# Patient Record
Sex: Male | Born: 1985 | Race: Black or African American | Hispanic: No | Marital: Single | Smoking: Current every day smoker
Health system: Southern US, Community
[De-identification: ages and names within clinical notes are randomized; demographics above are authoritative.]

## PROBLEM LIST (undated history)

## (undated) DIAGNOSIS — F319 Bipolar disorder, unspecified: Secondary | ICD-10-CM

## (undated) DIAGNOSIS — F209 Schizophrenia, unspecified: Secondary | ICD-10-CM

---

## 2009-03-25 ENCOUNTER — Emergency Department (HOSPITAL_BASED_OUTPATIENT_CLINIC_OR_DEPARTMENT_OTHER): Admission: EM | Admit: 2009-03-25 | Discharge: 2009-03-25 | Payer: Self-pay | Admitting: Emergency Medicine

## 2009-03-25 ENCOUNTER — Ambulatory Visit: Payer: Self-pay | Admitting: Diagnostic Radiology

## 2010-01-01 IMAGING — CR DG CHEST 2V
2 series · 2 of 2 positions shown · non-contrast
Comparison: None

CLINICAL DATA: Lightheaded times 2 weeks

CHEST - 2 VIEW

[w chest pa]
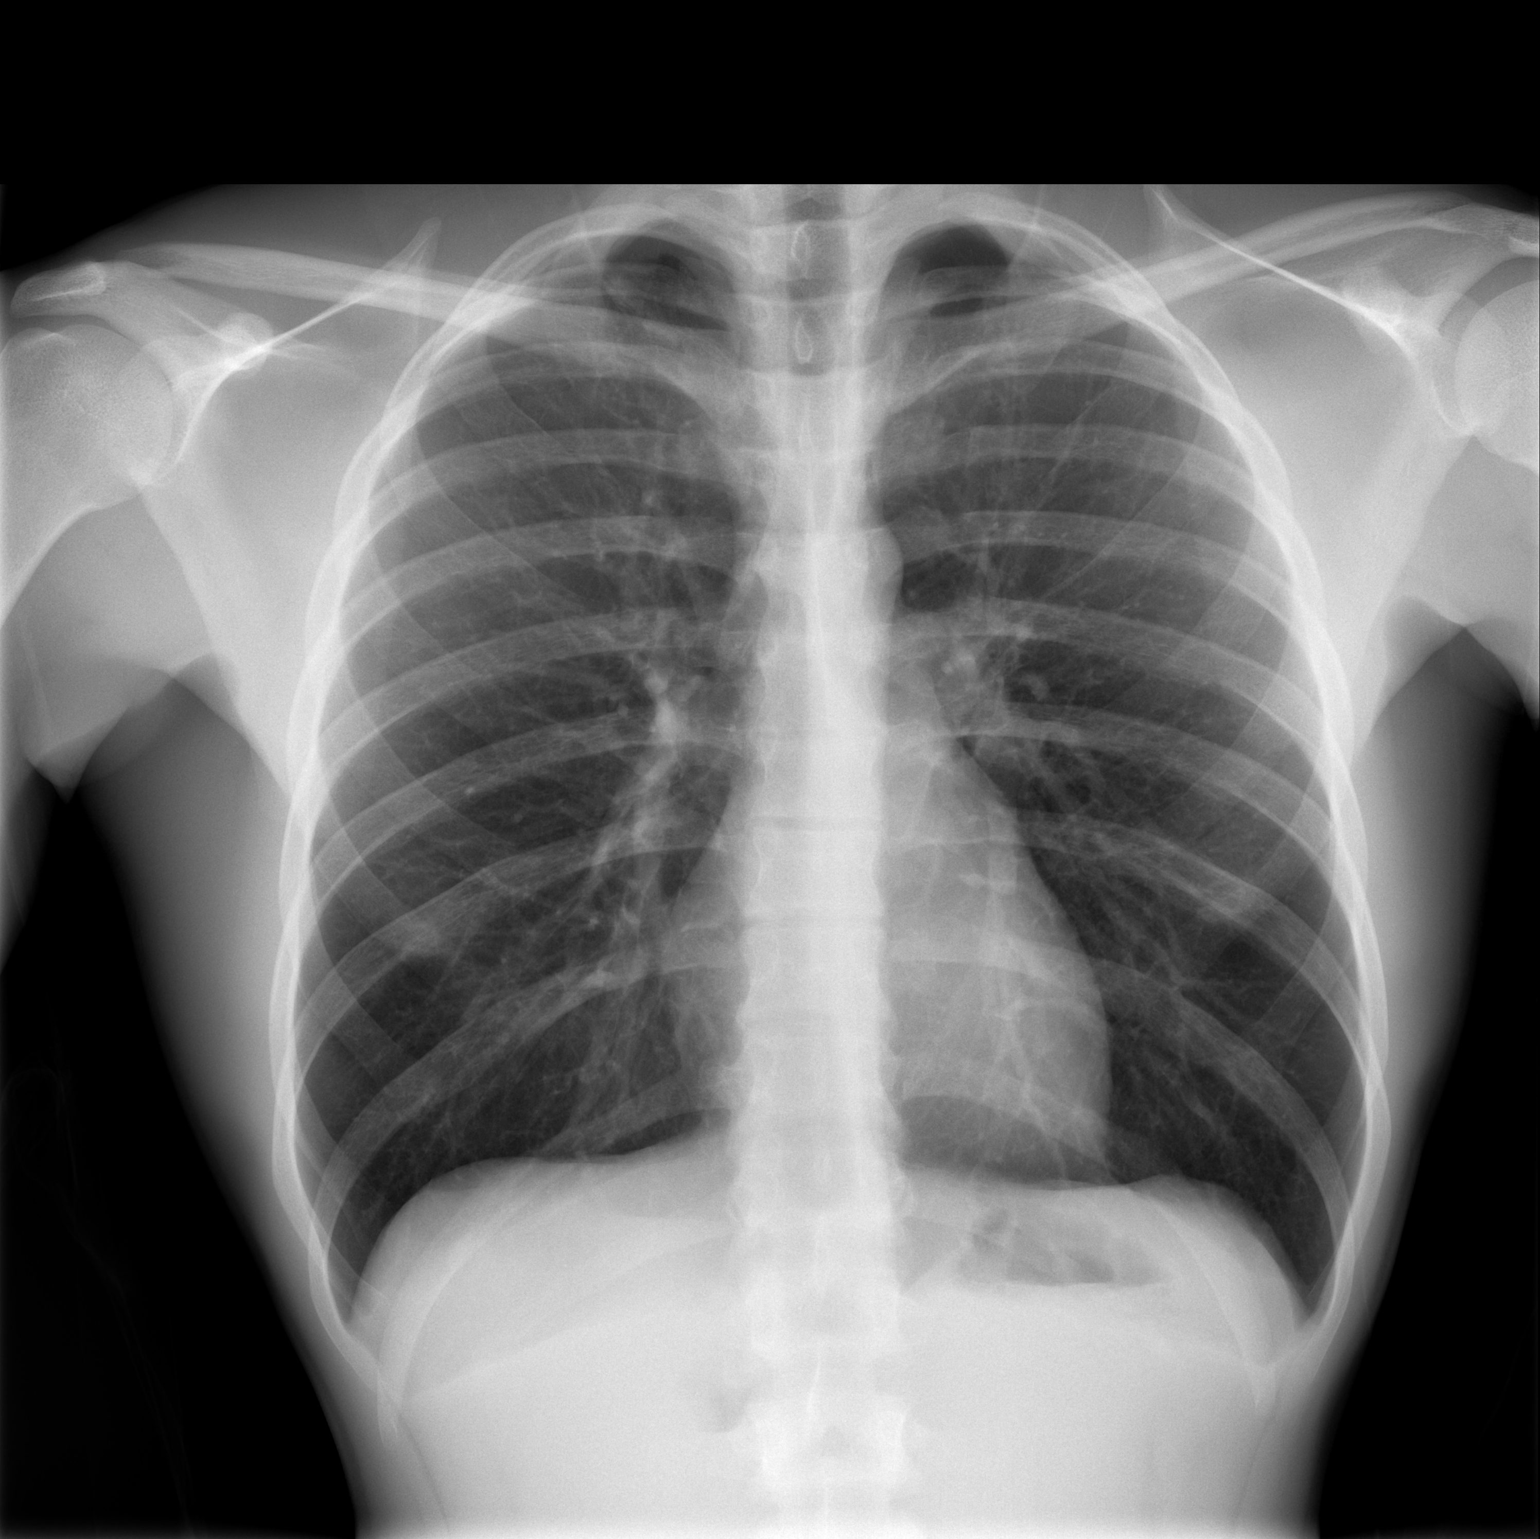

[w chest lat]
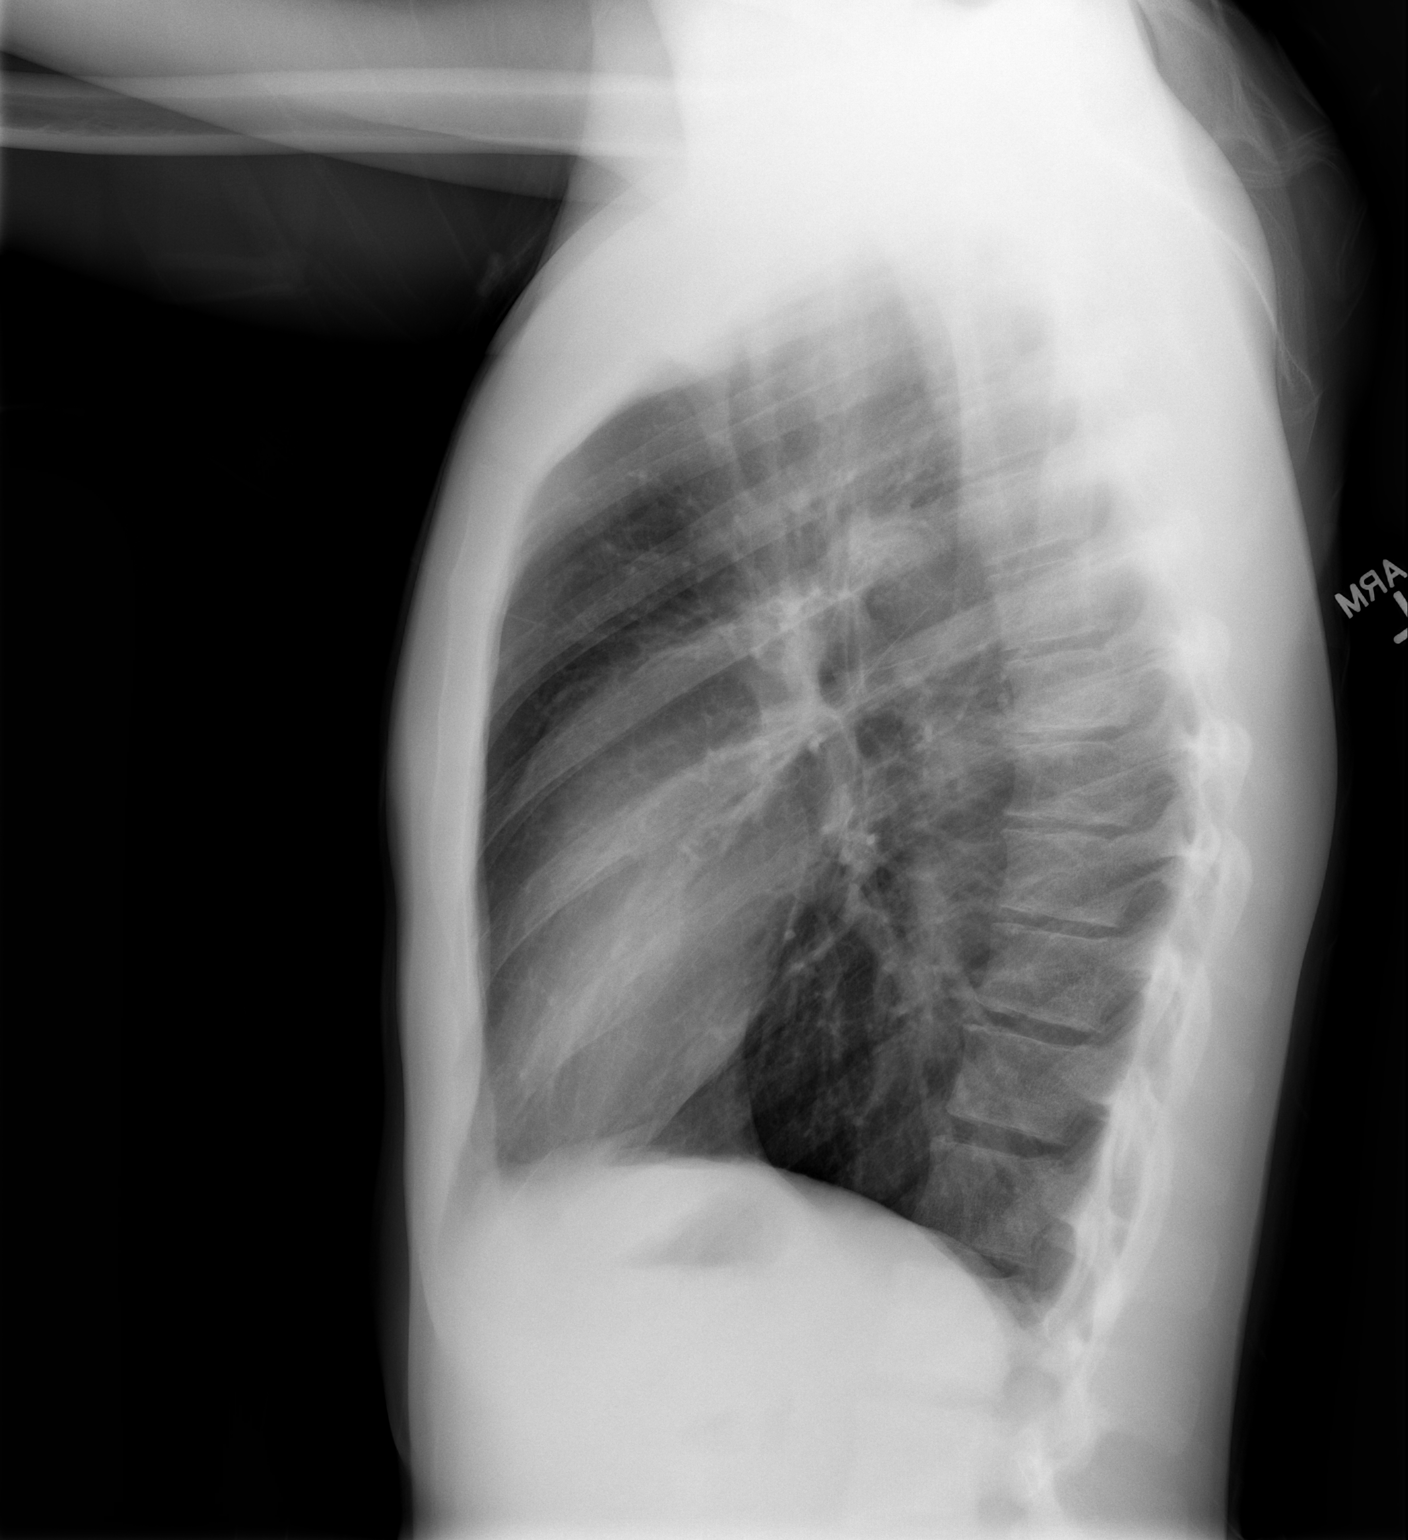

[2 of 2 positions shown; findings below may reference images not displayed]

FINDINGS: Normal mediastinum and cardiac silhouette.  Costophrenic
angles are clear.  No evidence effusion, infiltrate, or
pneumothorax.
IMPRESSION: No acute cardiopulmonary process.

## 2010-02-11 ENCOUNTER — Emergency Department (HOSPITAL_BASED_OUTPATIENT_CLINIC_OR_DEPARTMENT_OTHER): Admission: EM | Admit: 2010-02-11 | Discharge: 2010-02-11 | Payer: Self-pay | Admitting: Emergency Medicine

## 2013-05-03 ENCOUNTER — Encounter (HOSPITAL_BASED_OUTPATIENT_CLINIC_OR_DEPARTMENT_OTHER): Payer: Self-pay | Admitting: Emergency Medicine

## 2013-05-03 ENCOUNTER — Emergency Department (HOSPITAL_BASED_OUTPATIENT_CLINIC_OR_DEPARTMENT_OTHER)
Admission: EM | Admit: 2013-05-03 | Discharge: 2013-05-03 | Disposition: A | Discharge status: Home / Self Care | Payer: Self-pay | Attending: Emergency Medicine | Admitting: Emergency Medicine

## 2013-05-03 DIAGNOSIS — B360 Pityriasis versicolor: Secondary | ICD-10-CM | POA: Insufficient documentation

## 2013-05-03 DIAGNOSIS — F17200 Nicotine dependence, unspecified, uncomplicated: Secondary | ICD-10-CM | POA: Insufficient documentation

## 2013-05-03 MED ORDER — CLOTRIMAZOLE-BETAMETHASONE 1-0.05 % EX CREA: TOPICAL_CREAM | CUTANEOUS | Status: DC

## 2013-05-03 NOTE — ED Provider Notes (Signed)
CSN: [id number]     Arrival date & time 05/03/13  0349 History   First MD Initiated Contact with Patient 05/03/13 0404     Chief Complaint  Patient presents with  . Rash   (Consider location/radiation/quality/duration/timing/severity/associated sxs/prior Treatment) HPI Comments: Patient is an otherwise healthy 27 year old male presents to the emergency department with complaints of rash on his neck. This has been present for the past year but has gotten worse over the past several days. He noticed this morning that was spreading towards the front of his neck decided to come in tonight to be seen for it. He denies any fevers or chills. He has tried no over-the-counter creams.  Patient is a 27 y.o. male presenting with rash. The history is provided by the patient.  Rash Location: Back of neck. Quality: dryness and itchiness   Severity:  Mild Onset quality:  Gradual Timing:  Constant Progression:  Worsening   History reviewed. No pertinent past medical history. History reviewed. No pertinent past surgical history. No family history on file. History  Substance Use Topics  . Smoking status: Current Every Day Smoker  . Smokeless tobacco: Not on file  . Alcohol Use: Yes    Review of Systems  Skin: Positive for rash.  All other systems reviewed and are negative.    Allergies  Review of patient's allergies indicates no known allergies.  Home Medications  No current outpatient prescriptions on file. BP 121/73  Pulse 84  Temp(Src) 98 F (36.7 C) (Oral)  Resp 18  SpO2 98% Physical Exam  Nursing note and vitals reviewed. Constitutional: He appears well-developed and well-nourished.  HENT:  Head: Normocephalic and atraumatic.  Neck: Normal range of motion. Neck supple.  Musculoskeletal: Normal range of motion.  Neurological: He is alert.  Skin:  There are multiple hypopigmented circular lesions to the back of the neck and right side of the neck.  They are slightly raised but  there is no warmth or erythema.    ED Course  Procedures (including critical care time) Labs Review Labs Reviewed - No data to display Imaging Review No results found.  MDM  No diagnosis found. This appears to be some form of fungal type infection, possibly tinea versicolor. Will treat with Lotrisone, when necessary followup.    Jeffrey Lyons, MD 05/03/13 [id number]

## 2013-05-03 NOTE — ED Notes (Signed)
Several areas of dry skin with lightened pigmentation on right side of neck x 1 year. Pt states occasional itching

## 2017-07-17 ENCOUNTER — Encounter (HOSPITAL_BASED_OUTPATIENT_CLINIC_OR_DEPARTMENT_OTHER): Payer: Self-pay | Admitting: *Deleted

## 2017-07-17 ENCOUNTER — Other Ambulatory Visit: Payer: Self-pay

## 2017-07-17 ENCOUNTER — Emergency Department (HOSPITAL_BASED_OUTPATIENT_CLINIC_OR_DEPARTMENT_OTHER)
Admission: EM | Admit: 2017-07-17 | Discharge: 2017-07-17 | Disposition: A | Discharge status: Home / Self Care | Payer: 59 | Attending: Emergency Medicine | Admitting: Emergency Medicine

## 2017-07-17 DIAGNOSIS — R369 Urethral discharge, unspecified: Secondary | ICD-10-CM | POA: Diagnosis present

## 2017-07-17 DIAGNOSIS — Z202 Contact with and (suspected) exposure to infections with a predominantly sexual mode of transmission: Secondary | ICD-10-CM | POA: Diagnosis not present

## 2017-07-17 DIAGNOSIS — F17210 Nicotine dependence, cigarettes, uncomplicated: Secondary | ICD-10-CM | POA: Diagnosis not present

## 2017-07-17 LAB — URINALYSIS, ROUTINE W REFLEX MICROSCOPIC
BILIRUBIN URINE: NEGATIVE
GLUCOSE, UA: NEGATIVE mg/dL
HGB URINE DIPSTICK: NEGATIVE
Ketones, ur: NEGATIVE mg/dL
Leukocytes, UA: NEGATIVE
Nitrite: NEGATIVE
PROTEIN: NEGATIVE mg/dL
Specific Gravity, Urine: 1.025 (ref 1.005–1.030)
pH: 6.5 (ref 5.0–8.0)

## 2017-07-17 NOTE — ED Notes (Signed)
ED Provider at bedside. 

## 2017-07-17 NOTE — ED Provider Notes (Signed)
[facility] EMERGENCY DEPARTMENT Provider Note   CSN: [id number] Arrival date & time: 07/17/17  1437     History   Chief Complaint Chief Complaint  Patient presents with  . Penile Discharge    HPI Jeffrey Marshall is a 31 y.o. male.  HPI  The patient is a 31 year old male who has recently gotten out of a relationship and is concerned that he may have an STD because he is having some penile discharge.  The penile discharge is clear and watery, similar to urine, he is not seeing any thick milky discharge and has no dysuria abdominal pain or fevers.  Nothing makes this better or worse, has never had symptoms like this.  Is taken no medication prior to arrival.  History reviewed. No pertinent past medical history.  There are no active problems to display for this patient.   History reviewed. No pertinent surgical history.     Home Medications    Prior to Admission medications   Not on File    Family History History reviewed. No pertinent family history.  Social History Social History   Tobacco Use  . Smoking status: Current Every Day Smoker  . Smokeless tobacco: Never Used  Substance Use Topics  . Alcohol use: Yes    Comment: socially  . Drug use: No     Allergies   Patient has no known allergies.   Review of Systems Review of Systems  Constitutional: Negative for fever.  Gastrointestinal: Negative for abdominal pain, nausea and vomiting.  Genitourinary: Positive for discharge. Negative for genital sores, penile pain and testicular pain.     Physical Exam Updated Vital Signs BP 139/87 (BP Location: Right Arm)   Pulse 70   Temp 99.1 F (37.3 C) (Oral)   Resp 16   Ht 6' (1.829 m)   Wt 65.8 kg (145 lb)   SpO2 100%   BMI 19.67 kg/m   Physical Exam  Constitutional: He appears well-developed and well-nourished.  HENT:  Head: Normocephalic and atraumatic.  Eyes: Conjunctivae are normal. Right eye exhibits no discharge. Left eye  exhibits no discharge.  Pulmonary/Chest: Effort normal. No respiratory distress.  Genitourinary:  Genitourinary Comments: Normal-appearing circumcised penis, small amount of clear discharge at the urethral meatus.  This is watery like urine.  There is no purulent material, no tenderness, no blood, no rashes  Neurological: He is alert. Coordination normal.  Skin: Skin is warm and dry. No rash noted. He is not diaphoretic. No erythema.  Psychiatric: He has a normal mood and affect.  Nursing note and vitals reviewed.    ED Treatments / Results  Labs (all labs ordered are listed, but only abnormal results are displayed) Labs Reviewed  URINALYSIS, ROUTINE W REFLEX MICROSCOPIC  GC/CHLAMYDIA PROBE AMP ([place]     Radiology No results found.  Procedures Procedures (including critical care time)  Medications Ordered in ED Medications - No data to display   Initial Impression / Assessment and Plan / ED Course  I have reviewed the triage vital signs and the nursing notes.  Pertinent labs & imaging results that were available during my care of the patient were reviewed by me and considered in my medical decision making (see chart for details).     The patient's symptoms are not terribly concerning for an STD.  He has no dysuria and no thick discharge.  He has no burning in the urethra.  He states that he mostly see this after urination.  STD testing has been sent with a urethral swab, urinalysis negative, stable for discharge.  He was informed that we will call him should this test be positive STD counseling given.  Final Clinical Impressions(s) / ED Diagnoses   Final diagnoses:  Penile discharge    ED Discharge Orders    None       Eber HongMiller, Edinson Domeier, MD 07/17/17 1515

## 2017-07-17 NOTE — ED Triage Notes (Signed)
Pt reports penile discharge x 1 week. Denies pain.

## 2017-07-17 NOTE — Discharge Instructions (Signed)
Your urine sample was normal, your STD testing will come back within a couple of days.  If you do not get a phone call by Tuesday you do not have an STD.  We will call you on the phone to let you know if you have an STD and we will prescribe medication if it is positive.  Please do not have unprotected sex with anybody, always wear a condom, no sexual activity until you know if you have an STD.

## 2017-07-18 LAB — GC/CHLAMYDIA PROBE AMP (~~LOC~~) NOT AT ARMC
Chlamydia: NEGATIVE
Neisseria Gonorrhea: NEGATIVE

## 2017-09-03 ENCOUNTER — Emergency Department (HOSPITAL_BASED_OUTPATIENT_CLINIC_OR_DEPARTMENT_OTHER)
Admission: EM | Admit: 2017-09-03 | Discharge: 2017-09-03 | Disposition: A | Discharge status: Home / Self Care | Payer: Self-pay | Attending: Emergency Medicine | Admitting: Emergency Medicine

## 2017-09-03 ENCOUNTER — Other Ambulatory Visit: Payer: Self-pay

## 2017-09-03 ENCOUNTER — Encounter (HOSPITAL_BASED_OUTPATIENT_CLINIC_OR_DEPARTMENT_OTHER): Payer: Self-pay | Admitting: Emergency Medicine

## 2017-09-03 DIAGNOSIS — F17200 Nicotine dependence, unspecified, uncomplicated: Secondary | ICD-10-CM | POA: Insufficient documentation

## 2017-09-03 DIAGNOSIS — R0981 Nasal congestion: Secondary | ICD-10-CM | POA: Insufficient documentation

## 2017-09-03 MED ORDER — FLUTICASONE PROPIONATE 50 MCG/ACT NA SUSP: 2.0000 | Freq: Every day | NASAL | 0 refills | Status: AC

## 2017-09-03 NOTE — ED Triage Notes (Signed)
Pt c/o nasal congestion x 2 months. Pt has tried mucinex and benadryl with some relief.

## 2017-09-03 NOTE — Discharge Instructions (Signed)

## 2017-09-03 NOTE — ED Provider Notes (Signed)
Emergency Department Provider Note   I have reviewed the triage vital signs and the nursing notes.   HISTORY  Chief Complaint Nasal Congestion   HPI Jeffrey Marshall is a 31 y.o. male to the emergency department for evaluation 2 months.  Symptoms improved and that he seemed to get sick again.  States his nasal congestion continues to improve but is still present and he feels like it has gone on too Jeffrey Marshall.  He has been taking Mucinex with no relief.  He denies face pain or fever.  No vision changes.  No difficulty breathing.    History reviewed. No pertinent past medical history.  There are no active problems to display for this patient.   History reviewed. No pertinent surgical history.  Current Outpatient Rx  . Order #: [id number] Class: Historical Med  . Order #: [id number] Class: Print    Allergies Patient has no known allergies.  No family history on file.  Social History Social History   Tobacco Use  . Smoking status: Current Every Day Smoker  . Smokeless tobacco: Never Used  Substance Use Topics  . Alcohol use: Yes    Comment: socially  . Drug use: No    Review of Systems  Constitutional: No fever/chills Eyes: No visual changes. ENT: No sore throat. Positive nasal congestion.  Cardiovascular: Denies chest pain. Respiratory: Denies shortness of breath. Gastrointestinal: No abdominal pain.  No nausea, no vomiting.  No diarrhea.  No constipation. Genitourinary: Negative for dysuria. Musculoskeletal: Negative for back pain. Skin: Negative for rash. Neurological: Negative for headaches, focal weakness or numbness.  10-point ROS otherwise negative.  ____________________________________________   PHYSICAL EXAM:  VITAL SIGNS: ED Triage Vitals  Enc Vitals Group     BP 09/03/17 1247 110/60     Pulse Rate 09/03/17 1247 82     Resp 09/03/17 1247 18     Temp 09/03/17 1247 99.1 F (37.3 C)     Temp Source 09/03/17 1247 Oral     SpO2 09/03/17 1247 98 %   Weight 09/03/17 1247 145 lb (65.8 kg)     Height 09/03/17 1247 5\' 11"  (1.803 m)     Pain Score 09/03/17 1258 0   Constitutional: Alert and oriented. Well appearing and in no acute distress. Eyes: Conjunctivae are normal.  Head: Atraumatic. Nose: Positive mild congestion/rhinnorhea. No sinus tenderness.  Mouth/Throat: Mucous membranes are moist.  Oropharynx with mild erythema.  Neck: No stridor.   Cardiovascular: Good peripheral circulation.  Respiratory: Normal respiratory effort. Gastrointestinal: No distention.  Musculoskeletal: No lower extremity tenderness nor edema. No gross deformities of extremities. Neurologic:  Normal speech and language. No gross focal neurologic deficits are appreciated.  Skin:  Skin is warm, dry and intact. No rash noted.  ____________________________________________  RADIOLOGY  None ____________________________________________   PROCEDURES  Procedure(s) performed:   Procedures  None ____________________________________________   INITIAL IMPRESSION / ASSESSMENT AND PLAN / ED COURSE  Pertinent labs & imaging results that were available during my care of the patient were reviewed by me and considered in my medical decision making (see chart for details).  Nasal congestion over the past 2 months.  Patient seems to have had multiple upper respiratory infections and is recovering from these.  States his symptoms are improving but he continues to have some congestion.  Plan to start Flonase and continue Mucinex as needed.  To follow with his primary care physician.  At this time, I do not feel there is any life-threatening condition present. I have reviewed  and discussed all results (EKG, imaging, lab, urine as appropriate), exam findings with patient. I have reviewed nursing notes and appropriate previous records.  I feel the patient is safe to be discharged home without further emergent workup. Discussed usual and customary return precautions.  Patient and family (if present) verbalize understanding and are comfortable with this plan.  Patient will follow-up with their primary care provider. If they do not have a primary care provider, information for follow-up has been provided to them. All questions have been answered.  ____________________________________________  FINAL CLINICAL IMPRESSION(S) / ED DIAGNOSES  Final diagnoses:  Nasal congestion    NEW OUTPATIENT MEDICATIONS STARTED DURING THIS VISIT:  Flonase   Note:  This document was prepared using Dragon voice recognition software and may include unintentional dictation errors.  Alona BeneJoshua Ivon Oelkers, MD Emergency Medicine    Shama Monfils, Arlyss RepressJoshua G, MD 09/03/17 [phone]

## 2018-04-03 ENCOUNTER — Other Ambulatory Visit: Payer: Self-pay

## 2018-04-03 ENCOUNTER — Emergency Department (HOSPITAL_BASED_OUTPATIENT_CLINIC_OR_DEPARTMENT_OTHER)
Admission: EM | Admit: 2018-04-03 | Discharge: 2018-04-03 | Disposition: A | Discharge status: Home / Self Care | Payer: Self-pay | Attending: Emergency Medicine | Admitting: Emergency Medicine

## 2018-04-03 ENCOUNTER — Encounter (HOSPITAL_BASED_OUTPATIENT_CLINIC_OR_DEPARTMENT_OTHER): Payer: Self-pay | Admitting: *Deleted

## 2018-04-03 DIAGNOSIS — F17200 Nicotine dependence, unspecified, uncomplicated: Secondary | ICD-10-CM | POA: Insufficient documentation

## 2018-04-03 DIAGNOSIS — Z113 Encounter for screening for infections with a predominantly sexual mode of transmission: Secondary | ICD-10-CM

## 2018-04-03 DIAGNOSIS — Z202 Contact with and (suspected) exposure to infections with a predominantly sexual mode of transmission: Secondary | ICD-10-CM | POA: Insufficient documentation

## 2018-04-03 DIAGNOSIS — R369 Urethral discharge, unspecified: Secondary | ICD-10-CM | POA: Insufficient documentation

## 2018-04-03 LAB — URINALYSIS, ROUTINE W REFLEX MICROSCOPIC
BILIRUBIN URINE: NEGATIVE
Glucose, UA: NEGATIVE mg/dL
Hgb urine dipstick: NEGATIVE
Ketones, ur: 15 mg/dL — AB
NITRITE: NEGATIVE
Protein, ur: NEGATIVE mg/dL
SPECIFIC GRAVITY, URINE: 1.025 (ref 1.005–1.030)
pH: 6 (ref 5.0–8.0)

## 2018-04-03 LAB — URINALYSIS, MICROSCOPIC (REFLEX): RBC / HPF: NONE SEEN RBC/hpf (ref 0–5)

## 2018-04-03 MED ORDER — CEFTRIAXONE SODIUM 250 MG IJ SOLR
250.0000 mg | Freq: Once | INTRAMUSCULAR | Status: AC
Start: 2018-04-03 — End: 2018-04-03
  Administered 2018-04-03: 250 mg via INTRAMUSCULAR
  Filled 2018-04-03: qty 250

## 2018-04-03 MED ORDER — AZITHROMYCIN 250 MG PO TABS
1000.0000 mg | ORAL_TABLET | Freq: Once | ORAL | Status: AC
  Administered 2018-04-03: 1000 mg via ORAL
  Filled 2018-04-03: qty 4

## 2018-04-03 MED ORDER — LIDOCAINE HCL (PF) 1 % IJ SOLN
INTRAMUSCULAR | Status: AC
  Administered 2018-04-03: 5 mL
  Filled 2018-04-03: qty 5

## 2018-04-03 NOTE — Discharge Instructions (Signed)
Please read and follow all provided instructions.  Today you were cultured for a sexually transmitted disease like chlamydia or gonorrhea. You have elected to be treated at this time as a precaution . Results of your gonorrhea and chlamydia tests are pending and you will be notified if they are positive. Please refrain from sexual activity for 48 hours  It is very important to practice safe sex and use condoms when sexually active. If the test is positive, please refrain from sexual activity for 10 days for the medicine to take effect and notify your sexual partners for the last 6 months as they, too, may want to be tested. The website https://garcia.net/http://www.dontspreadit.com/ can be used to send anonymous text messages or emails to alert sexual contacts.  Because you have had unprotected sex, you may want to consider getting tested for HIV (and Syphilis) as well. If you did not elect to be tested for HIV or Syphilis (blood tests) in the department today, you can follow up at the Health Department for further testing for this. Remember that latex condoms or abstinence are the only way to prevent against STDs or HIV.  If you should develop severe or worsening pain in your abdomen or the pelvis or develop severe fevers, nausea or vomiting that prevent you from taking your medications, return to the emergency department immediately. Otherwise contact your local physician or county health department for a follow up appointment to complete STD testing including HIV and syphilis. I have attached the information for the health department.   Gonorrhea and Chlamydia SYMPTOMS  In males, symptoms include:  -Burning with urination.  -Pain in the testicles.  -Watery mucous-like discharge from the penis.  It can cause longstanding (chronic) pelvic pain after frequent infections.  TREATMENT  -You have elected to be treated at this time as a precaution. Return if you develop an itchy rash, swelling in your mouth or lips, or  difficulty breathing.  -This is a sexually transmitted infection. So you are also at risk for other sexually transmitted diseases, including HIV (AIDS), it is recommended that you get tested. HOME CARE INSTRUCTIONS  Warning: This infection is contagious. Do not have sex until treatment is completed. Follow up at your caregiver's office or the clinic to which you were referred. If your diagnosis (learning what is wrong) is confirmed by culture or some other method, your recent sexual contacts need treatment. Even if they are symptom free or have a negative culture or evaluation, they should be treated.  PREVENTION  Practice safe sex, use condoms, have only one sex partner and be sure your sex partner is not having sex with others.  Ask your caregiver to test you for chlamydia at your regular checkups or sooner if you are having symptoms.  Ask for further information if you are pregnant.  SEEK IMMEDIATE MEDICAL CARE IF:  Please return to the Emergency Department if you do not get better, if you get worse, or new symptoms OR You develop an oral temperature above 102 F (38.9 C), not controlled by medications or lasting more than 2 days.  You develop an increase in pain.  You develop any type of abnormal discharge.  You develop vaginal bleeding and it is not time for your period.  You develop painful intercourse.  Please return if you have any other emergent concerns Additional Information:  Your vital signs today were: BP 125/72    Pulse 62    Temp 98.3 F (36.8 C) (Oral)  Resp 20    Ht 6' (1.829 m)    Wt 72.6 kg (160 lb)    SpO2 99%    BMI 21.70 kg/m  If your blood pressure (BP) was elevated above 135/85 this visit, please have this repeated by your doctor within one month. ---------------

## 2018-04-03 NOTE — ED Provider Notes (Signed)
[facility] EMERGENCY DEPARTMENT Provider Note   CSN: [id number] Arrival date & time: 04/03/18  1829     History   Chief Complaint Chief Complaint  Patient presents with  . Penile Discharge    HPI Jeffrey Marshall is a 32 y.o. male who presents emergency department today for 3-4 days of penile discharge.  Patient reports that he has had a thin white discharge from his penis for the last 4 days.  He notes a new sexual partner approximately 2 weeks ago and states that they do not use protection.  He notes that he has had multiple male partners in the last 6 months.  He denies prior history of STDs.  No other complaints at this time.  He denies any fever, abdominal pain, nausea/vomiting, painful bowel movements, urinary frequency, urinary urgency, dysuria, flank pain, hematuria, scrotal/testicular pain/swelling, or rashes/ulcers/lesions.  HPI  History reviewed. No pertinent past medical history.  There are no active problems to display for this patient.   History reviewed. No pertinent surgical history.      Home Medications    Prior to Admission medications   Medication Sig Start Date End Date Taking? Authorizing Provider  fluticasone (FLONASE) 50 MCG/ACT nasal spray Place 2 sprays into both nostrils daily for 14 days. 09/03/17 09/17/17  Long, Arlyss RepressJoshua G, MD  guaiFENesin (MUCINEX) 600 MG 12 hr tablet Take by mouth 2 (two) times daily.    [provider]    Family History No family history on file.  Social History Social History   Tobacco Use  . Smoking status: Current Every Day Smoker  . Smokeless tobacco: Never Used  Substance Use Topics  . Alcohol use: Yes    Comment: socially  . Drug use: No     Allergies   Patient has no known allergies.   Review of Systems Review of Systems  All other systems reviewed and are negative.    Physical Exam Updated Vital Signs BP 125/72   Pulse 62   Temp 98.3 F (36.8 C) (Oral)   Resp 20   Ht 6'  (1.829 m)   Wt 72.6 kg (160 lb)   SpO2 99%   BMI 21.70 kg/m   Physical Exam  Constitutional: He appears well-developed and well-nourished.  HENT:  Head: Normocephalic and atraumatic.  Right Ear: External ear normal.  Left Ear: External ear normal.  Eyes: Conjunctivae are normal. Right eye exhibits no discharge. Left eye exhibits no discharge. No scleral icterus.  Pulmonary/Chest: Effort normal. No respiratory distress.  Abdominal: Soft. Normal appearance and bowel sounds are normal. He exhibits no distension. There is no rigidity, no rebound, no guarding and no CVA tenderness.  Genitourinary: Testes normal and penis normal. Right testis shows no mass, no swelling and no tenderness. Left testis shows no mass, no swelling and no tenderness. Circumcised. No phimosis, paraphimosis, hypospadias or penile erythema.  Genitourinary Comments: Chaperone present during genital exam. No external genital lesions noted, no bumps on head of penis, specifically no vesicles concerning for herpes or chancre suggestive of syphillis, no pain with palpation, no discharge or urethritis noted, scrotum and testicles w/o erythema or swelling, NTTP   Neurological: He is alert.  Skin: No pallor.  Psychiatric: He has a normal mood and affect.  Nursing note and vitals reviewed.    ED Treatments / Results  Labs (all labs ordered are listed, but only abnormal results are displayed) Labs Reviewed  URINALYSIS, ROUTINE W REFLEX MICROSCOPIC - Abnormal; Notable for the following components:  Result Value   APPearance HAZY (*)    Ketones, ur 15 (*)    Leukocytes, UA SMALL (*)    All other components within normal limits  URINALYSIS, MICROSCOPIC (REFLEX) - Abnormal; Notable for the following components:   Bacteria, UA RARE (*)    All other components within normal limits  RPR  HIV ANTIBODY (ROUTINE TESTING)  GC/CHLAMYDIA PROBE AMP ([place]) NOT AT [facility]    EKG None  Radiology No results  found.  Procedures Procedures (including critical care time)  Medications Ordered in ED Medications  cefTRIAXone (ROCEPHIN) injection 250 mg (has no administration in time range)  azithromycin (ZITHROMAX) tablet 1,000 mg (has no administration in time range)  lidocaine (PF) (XYLOCAINE) 1 % injection (has no administration in time range)     Initial Impression / Assessment and Plan / ED Course  I have reviewed the triage vital signs and the nursing notes.  Pertinent labs & imaging results that were available during my care of the patient were reviewed by me and considered in my medical decision making (see chart for details).     32 y.o. male presenting with penile discharge. He reports new sexual partner ~2 weeks ago. Patient is afebrile without abdominal tenderness, abdominal pain or painful bowel movements to indicate prostatitis.  No tenderness to palpation of the testes or epididymis to suggest orchitis or epididymitis.  STD cultures obtained including HIV, syphilis, gonorrhea and chlamydia. Patient to be discharged with instructions to follow up with PCP. Discussed importance of using protection when sexually active. Pt understands that they have GC/Chlamydia cultures pending and that they will need to inform all sexual partners if results return positive. Patient has been treated prophylactically with azithromycin and Rocephin.  Discussed with patient to follow-up with the health department for all future testing and treatment.  Return precautions discussed.  Patient appears safe for discharge.  Final Clinical Impressions(s) / ED Diagnoses   Final diagnoses:  Penile discharge  Screening examination for STD (sexually transmitted disease)    ED Discharge Orders    None       Princella Pellegrini 04/03/18 1939    Terrilee Files, MD 04/04/18 [phone]

## 2018-04-03 NOTE — ED Triage Notes (Signed)
Penile discharge x 3 days 

## 2018-04-05 LAB — RPR: RPR: NONREACTIVE

## 2018-04-05 LAB — HIV ANTIBODY (ROUTINE TESTING W REFLEX): HIV SCREEN 4TH GENERATION: NONREACTIVE

## 2018-04-05 LAB — GC/CHLAMYDIA PROBE AMP (~~LOC~~) NOT AT ARMC
Chlamydia: NEGATIVE
NEISSERIA GONORRHEA: POSITIVE — AB

## 2024-06-06 ENCOUNTER — Emergency Department (HOSPITAL_BASED_OUTPATIENT_CLINIC_OR_DEPARTMENT_OTHER)
Admission: EM | Admit: 2024-06-06 | Discharge: 2024-06-06 | Disposition: A | Discharge status: Home / Self Care | Attending: Emergency Medicine | Admitting: Emergency Medicine

## 2024-06-06 ENCOUNTER — Emergency Department (HOSPITAL_BASED_OUTPATIENT_CLINIC_OR_DEPARTMENT_OTHER)

## 2024-06-06 ENCOUNTER — Other Ambulatory Visit: Payer: Self-pay

## 2024-06-06 ENCOUNTER — Encounter (HOSPITAL_BASED_OUTPATIENT_CLINIC_OR_DEPARTMENT_OTHER): Payer: Self-pay

## 2024-06-06 DIAGNOSIS — R0789 Other chest pain: Secondary | ICD-10-CM | POA: Diagnosis present

## 2024-06-06 DIAGNOSIS — M940 Chondrocostal junction syndrome [Tietze]: Secondary | ICD-10-CM | POA: Insufficient documentation

## 2024-06-06 HISTORY — DX: Bipolar disorder, unspecified: F31.9

## 2024-06-06 HISTORY — DX: Schizophrenia, unspecified: F20.9

## 2024-06-06 LAB — BASIC METABOLIC PANEL WITH GFR
Anion gap: 11 (ref 5–15)
BUN: 12 mg/dL (ref 6–20)
CO2: 26 mmol/L (ref 22–32)
Calcium: 9.7 mg/dL (ref 8.9–10.3)
Chloride: 100 mmol/L (ref 98–111)
Creatinine, Ser: 0.98 mg/dL (ref 0.61–1.24)
GFR, Estimated: >60 mL/min (ref >60)
Glucose, Bld: 105 mg/dL — ABNORMAL HIGH (ref 70–99)
Potassium: 4.3 mmol/L (ref 3.5–5.1)
Sodium: 136 mmol/L (ref 135–145)

## 2024-06-06 LAB — CBC
HCT: 43.7 % (ref 39.0–52.0)
Hemoglobin: 15.3 g/dL (ref 13.0–17.0)
MCH: 32.6 pg (ref 26.0–34.0)
MCHC: 35 g/dL (ref 30.0–36.0)
MCV: 93 fL (ref 80.0–100.0)
Platelets: 185 K/uL (ref 150–400)
RBC: 4.7 MIL/uL (ref 4.22–5.81)
RDW: 12.8 % (ref 11.5–15.5)
WBC: 5.1 K/uL (ref 4.0–10.5)
nRBC: 0 % (ref 0.0–0.2)

## 2024-06-06 LAB — TROPONIN T, HIGH SENSITIVITY: Troponin T High Sensitivity: <15 ng/L (ref 0–19)

## 2024-06-06 MED ORDER — IBUPROFEN 800 MG PO TABS
800.0000 mg | ORAL_TABLET | Freq: Once | ORAL | Status: AC
  Administered 2024-06-06: 800 mg via ORAL
  Filled 2024-06-06: qty 1

## 2024-06-06 NOTE — ED Provider Notes (Signed)
 [facility] Provider Note   CSN: [id number] Arrival date & time: 06/06/24  1447     Patient presents with: Chest Pain   Jeffrey Marshall is a 38 y.o. male patient who presents to the emergency department today for further evaluation of right anterior lower chest wall pain.  He states that hurts to press on the area and take a deep breath.  He states when he pushes on the area he is able to take a deep breath without pain however.  Patient denies any trauma.  Has been there for 3 days.  Patient was seen evaluated urgent care.  Had negative respiratory panel including COVID and flu.  Pain is worse with movement as well.  He denies any recent travel, testosterone use, unilateral leg swelling, recent surgery or trauma, shortness of breath, fever, chills, cough, congestion.    Chest Pain      Prior to Admission medications   Medication Sig Start Date End Date Taking? Authorizing Provider  fluticasone  (FLONASE ) 50 MCG/ACT nasal spray Place 2 sprays into both nostrils daily for 14 days. 09/03/17 09/17/17  Marshall, Jeffrey G, MD  guaiFENesin (MUCINEX) 600 MG 12 hr tablet Take by mouth 2 (two) times daily.    [provider]    Allergies: Patient has no known allergies.    Review of Systems  Cardiovascular:  Positive for chest pain.  All other systems reviewed and are negative.   Updated Vital Signs BP 125/74   Pulse 62   Temp 98.2 F (36.8 C) (Oral)   Resp 12   Ht 5' 11 (1.803 m)   Wt 70.3 kg   SpO2 100%   BMI 21.62 kg/m   Physical Exam Vitals and nursing note reviewed.  Constitutional:      General: He is not in acute distress.    Appearance: Normal appearance.  HENT:     Head: Normocephalic and atraumatic.  Eyes:     General:        Right eye: No discharge.        Left eye: No discharge.  Cardiovascular:     Comments: Regular rate and rhythm.  S1/S2 are distinct without any evidence of murmur, rubs, or gallops.   Radial pulses are 2+ bilaterally.  Dorsalis pedis pulses are 2+ bilaterally.  No evidence of pedal edema. Pulmonary:     Comments: Clear to auscultation bilaterally.  Normal effort.  No respiratory distress.  No evidence of wheezes, rales, or rhonchi heard throughout. Chest:     Comments: Palpation of the anterior right lower chest wall just inferior to the nipple reproduces chest wall pain. Abdominal:     General: Abdomen is flat. Bowel sounds are normal. There is no distension.     Tenderness: There is no abdominal tenderness. There is no guarding or rebound.  Musculoskeletal:        General: Normal range of motion.     Cervical back: Neck supple.  Skin:    General: Skin is warm and dry.     Findings: No rash.  Neurological:     General: No focal deficit present.     Mental Status: He is alert.  Psychiatric:        Mood and Affect: Mood normal.        Behavior: Behavior normal.     (all labs ordered are listed, but only abnormal results are displayed) Labs Reviewed  BASIC METABOLIC PANEL WITH GFR - Abnormal; Notable for the  following components:      Result Value   Glucose, Bld 105 (*)    All other components within normal limits  CBC  TROPONIN T, HIGH SENSITIVITY    EKG: EKG Interpretation Date/Time:  Wednesday June 06 2024 14:57:17 EDT Ventricular Rate:  65 PR Interval:  121 QRS Duration:  86 QT Interval:  394 QTC Calculation: 410 R Axis:   91  Text Interpretation: Sinus rhythm Borderline right axis deviation Consider left ventricular hypertrophy Confirmed by Jeffrey Marshall ([id number]) on 06/06/2024 2:59:41 PM  Radiology: [facility] Chest 2 View Result Date: 06/06/2024 CLINICAL DATA:  Chest pain with inspiration for the past 3 days. Smoker. EXAM: CHEST - 2 VIEW COMPARISON:  03/25/2009 FINDINGS: Normal sized heart. Clear lungs with normal vascularity. The lungs are mildly hyperexpanded. Interval right upper lobe paraseptal bullous changes. No pneumothorax or pleural fluid.  Unremarkable bones. IMPRESSION: 1. No acute abnormality. 2. Changes of COPD including interval right upper lobe paraseptal emphysema. Electronically Signed   By: Jeffrey Marshall M.D.   On: 06/06/2024 16:00     Procedures   Medications Ordered in the ED  ibuprofen (ADVIL) tablet 800 mg (800 mg Oral Given 06/06/24 1542)     Medical Decision Making Jeffrey Marshall is a 38 y.o. male patient who presents to the emergency department today for further evaluation of chest pain. Exam without evidence of volume overload so doubt heart failure. EKG without signs of active ischemia. Given the timing of pain to ER presentation, single troponin was normal so doubt NSTEMI. Presentation not consistent with acute PE (PERC negative), pneumothorax (not visualized on chest xr), thoracic aortic dissection, pericarditis, tamponade, pneumonia (no infectious symptoms, clear chest xr), myocarditis (no recent illness, neg trop). HEART score: 2 so discharge patient home with PMD follow up.  Patient was seen at [facility] yesterday for similar symptoms.  Diagnosed with costochondritis.  I think this is the likely diagnosis.  Will treat with NSAIDs.  Patient is nontoxic-appearing.  Vital signs normal.  Oxygenating well on room air.  Given the worsening pain with movement and palpation of the area this is the most likely diagnosis.  Strict turn precautions were discussed.  He is safe for discharge.   Amount and/or Complexity of Data Reviewed Labs: ordered. Radiology: ordered.  Risk Prescription drug management.     Final diagnoses:  Costochondritis    ED Discharge Orders     None          Jeffrey Marshall, [place] 06/06/24 1627    Geraldene Hamilton, MD 06/06/24 2257

## 2024-06-06 NOTE — ED Triage Notes (Signed)
 Pt states that he has had chest pain x 3 days. States that it hurts when takes a deep breath. States that he went to [facility] yesterday.

## 2024-06-06 NOTE — Discharge Instructions (Signed)
 I would like for you to take 600 mg ibuprofen every 6 hours as needed for pain.  You can follow-up with your primary care doctor for further evaluation.  You may return to the emergency department for any worsening symptoms.
